# Patient Record
Sex: Female | Born: 1990 | Race: White | Hispanic: No | Marital: Single
Health system: Southern US, Community
[De-identification: ages and names within clinical notes are randomized; demographics above are authoritative.]

---

## 2020-11-21 ENCOUNTER — Ambulatory Visit
Admission: RE | Admit: 2020-11-21 | Discharge: 2020-11-21 | Disposition: A | Discharge status: Home / Self Care | Payer: No Typology Code available for payment source | Source: Ambulatory Visit | Attending: Sports Medicine | Admitting: Sports Medicine

## 2020-11-21 ENCOUNTER — Ambulatory Visit (INDEPENDENT_AMBULATORY_CARE_PROVIDER_SITE_OTHER): Payer: Self-pay | Admitting: Family Medicine

## 2020-11-21 ENCOUNTER — Other Ambulatory Visit: Payer: Self-pay

## 2020-11-21 VITALS — BP 100/62 | Ht 64.0 in | Wt 165.0 lb

## 2020-11-21 DIAGNOSIS — E109 Type 1 diabetes mellitus without complications: Secondary | ICD-10-CM | POA: Insufficient documentation

## 2020-11-21 DIAGNOSIS — Z0189 Encounter for other specified special examinations: Secondary | ICD-10-CM

## 2020-11-21 DIAGNOSIS — Y9315 Activity, underwater diving and snorkeling: Secondary | ICD-10-CM

## 2020-11-21 DIAGNOSIS — E1069 Type 1 diabetes mellitus with other specified complication: Secondary | ICD-10-CM

## 2020-11-21 LAB — GLUCOSE, POCT (MANUAL RESULT ENTRY): POC Glucose: 74 mg/dl (ref 70–99)

## 2020-11-21 LAB — POCT HEMOGLOBIN: Hemoglobin: 13 g/dL (ref 11–14.6)

## 2020-11-21 LAB — POCT UA - GLUCOSE/PROTEIN
Glucose, UA: NEGATIVE
Protein, UA: POSITIVE — AB

## 2020-11-21 NOTE — Progress Notes (Signed)
   Office Visit Note   Patient: Valerie Finley           Date of Birth: 05-Sep-1990           MRN: [id number] Visit Date: 11/21/2020 Requested by: No referring provider defined for this encounter. PCP: No primary care provider on file.  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 Patient states her diabetes is well controlled, with no significant hypoglycemic events in several years. No history of Asthma, PTX, Seizures, or other significant medical problems. She considers herself overall very healthy. 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               ROS:   All other systems were reviewed and are negative.  Objective: Vital Signs: There were no vitals taken for this visit. No flowsheet data found.   No flowsheet data found.  Physical Exam:  General:  Alert and oriented, in no acute distress. Cardiac: Regular Rate rhythm, no murmurs, rubs or gallops Pulm:  CTAB. Breathing unlabored. No wheezes, rales, or rhonchi.  Abd: Soft, NTND. +BS.  Psy:  Normal mood, congruent affect. Skin:  No bruising or rashes Neuro: CN II-XII Intact bilaterally. Normal gait and balance.  MSK: Full Painless ROM in shoulders, hips, elbows, knees and wrists. Moves all extremities smoothly, with 5 out of 5 strength throughout upper and lower limbs.  Full spinal ROM without pain. Normal spinal curvature.   Imaging: Chest XR Wet read with no obvious acute cardiopulmonary process.   Assessment & Plan: 29yo F presenting to clinic for clearance to participate in SCUBA activities at the [facility]. Though she is a type 1 diabetic, her diabetes is well controlled on her current insulin regimen.  - Labs with trace proteinuria, however patient admits she is on  her menses.  - Discussed that Ideal blood glucose prior to dives is 150-300 prior to entering the water, without evidence of downtrending.  - Discussed avoiding excessively deep dives beyond 100', as hypoglycemia may mimic raptures.  - Discussed avoiding dive times greater than . She is instructed to have oral glucose snacks available at the poolside, as well as injectable glucagon in a location where it is easily accessible by co-workers if needed in an emergency.   - Cleared for diving with the above stipulations in place.     I was the preceptor for this visit and available for immediate consultation Marsa Aris, DO

## 2020-11-21 NOTE — Patient Instructions (Signed)
*  Head to [facility] to get your urine test and bloodwork. You will pay $27 at check-in. [address]  *Then go to [facility] to get your Xray done. You will pay $51 at check-in.  [address] in the [facility]  *After the xrays, come back here to get ALL of your results

## 2021-02-26 DIAGNOSIS — F411 Generalized anxiety disorder: Secondary | ICD-10-CM | POA: Diagnosis not present

## 2021-02-26 DIAGNOSIS — E039 Hypothyroidism, unspecified: Secondary | ICD-10-CM | POA: Diagnosis not present

## 2021-02-26 DIAGNOSIS — F331 Major depressive disorder, recurrent, moderate: Secondary | ICD-10-CM | POA: Diagnosis not present

## 2021-02-26 DIAGNOSIS — E1065 Type 1 diabetes mellitus with hyperglycemia: Secondary | ICD-10-CM | POA: Diagnosis not present

## 2021-03-14 DIAGNOSIS — E669 Obesity, unspecified: Secondary | ICD-10-CM | POA: Diagnosis not present

## 2021-03-14 DIAGNOSIS — E039 Hypothyroidism, unspecified: Secondary | ICD-10-CM | POA: Diagnosis not present

## 2021-03-14 DIAGNOSIS — E1065 Type 1 diabetes mellitus with hyperglycemia: Secondary | ICD-10-CM | POA: Diagnosis not present

## 2022-02-05 ENCOUNTER — Ambulatory Visit (INDEPENDENT_AMBULATORY_CARE_PROVIDER_SITE_OTHER): Payer: Self-pay | Admitting: Sports Medicine

## 2022-02-05 VITALS — BP 103/72 | Temp 98.1°F | Ht 64.0 in | Wt 155.0 lb

## 2022-02-05 DIAGNOSIS — Z0189 Encounter for other specified special examinations: Secondary | ICD-10-CM

## 2022-02-05 NOTE — Progress Notes (Signed)
  Valerie Finley - 31 y.o. female MRN [id number]  Date of birth: 12/12/1990    CHIEF COMPLAINT:   COVID clearance    SUBJECTIVE:   HPI:  This is a pleasant 31 year old female who comes to clinic to be evaluated for return to scuba diving at the [facility] after having COVID.  She tested positive for COVID-19 on 01/07/2022 with a home test.  Her symptoms at that time were nasal congestion, fever, loss of smell, and fatigue.  She does not report having much of a cough nor shortness of breath.  She did not take Paxlovid. She has since recovered quite well from COVID.  She feels like her energy levels are back to normal.  She does not have any shortness of breath with exertion.  ROS:     See HPI  PERTINENT  PMH / PSH FH / / SH:  Past Medical, Surgical, Social, and Family History Reviewed & Updated in the EMR.  Pertinent findings include:  T1DM  OBJECTIVE: BP 103/72   Temp 98.1 F (36.7 C)   Ht 5\' 4"  (1.626 m)   Wt 155 lb (70.3 kg)   BMI 26.61 kg/m   Physical Exam:  Vital signs are reviewed.  GEN: Alert and oriented, NAD Cardio: RRR, no murmur appreciated Pulm: Breathing unlabored, CTA bilat, no wheezing PSY: normal mood, congruent affect MSK: full ROM and symmetric strength in all extremities  ASSESSMENT & PLAN:  1. COVID 19 -Patient has recovered well from COVID earlier this month.  She has no residual symptoms and is tolerating full activity.  In accordance with the guidelines from the Diving and Hyperbaric Medicine Journal published in 2022, no additional work-up is required.  She is cleared to return to diving at the [facility]. She can follow up here as needed for all future sports medicine needs.  [facility], MD PGY-4, Sports Medicine Fellow [facility]  Patient seen and evaluated with the sports medicine fellow.  I agree with the above plan of care.  Valerie Finley is cleared to return to diving without restrictions.   Follow-up as needed.

## 2022-10-15 IMAGING — DX DG CHEST 2V
2 series · 2 of 2 positions shown · non-contrast
Comparison: None.

CLINICAL DATA: Athletic physical

EXAM:
CHEST - 2 VIEW

[dg chest 2 view (1 of 2)]
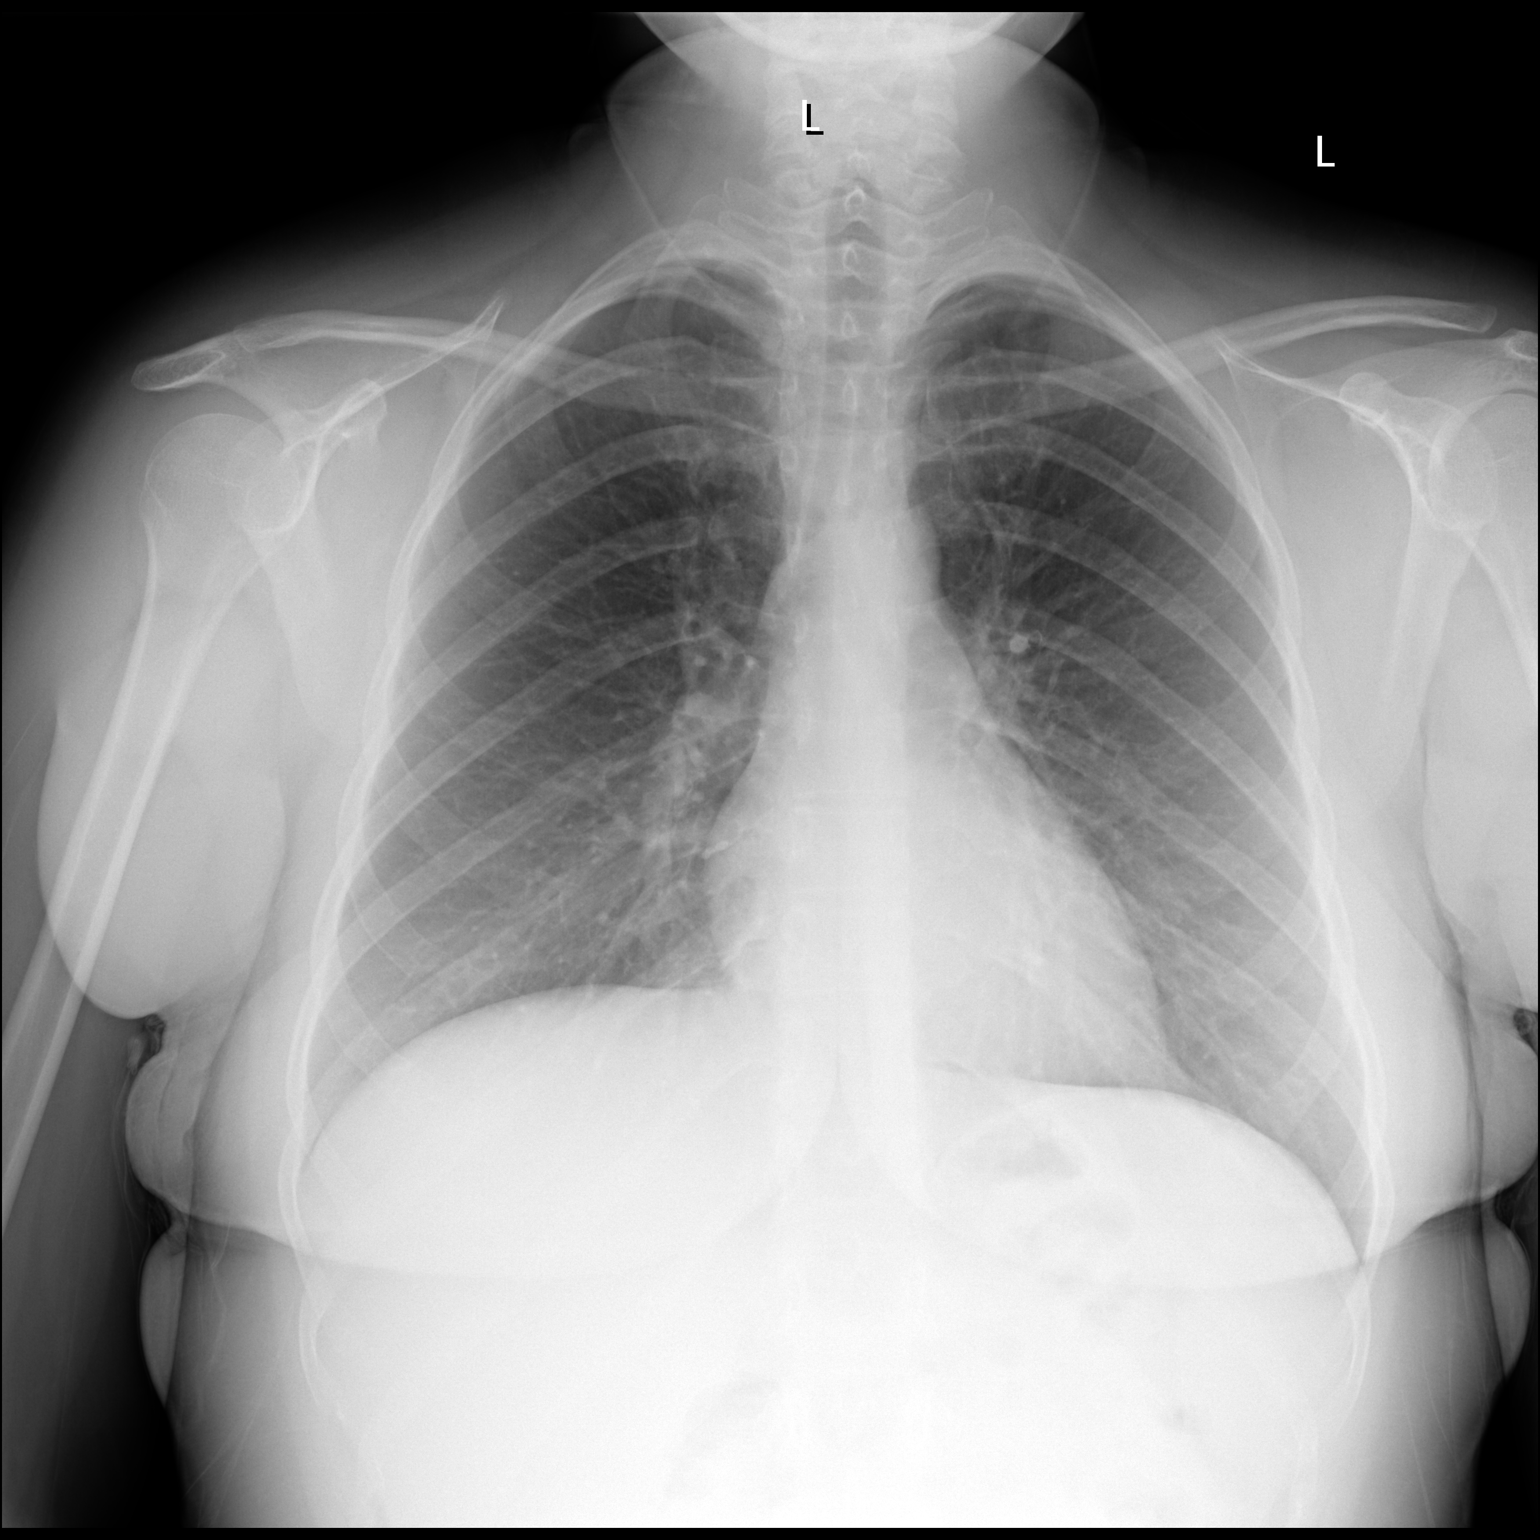

[dg chest 2 view (2 of 2)]
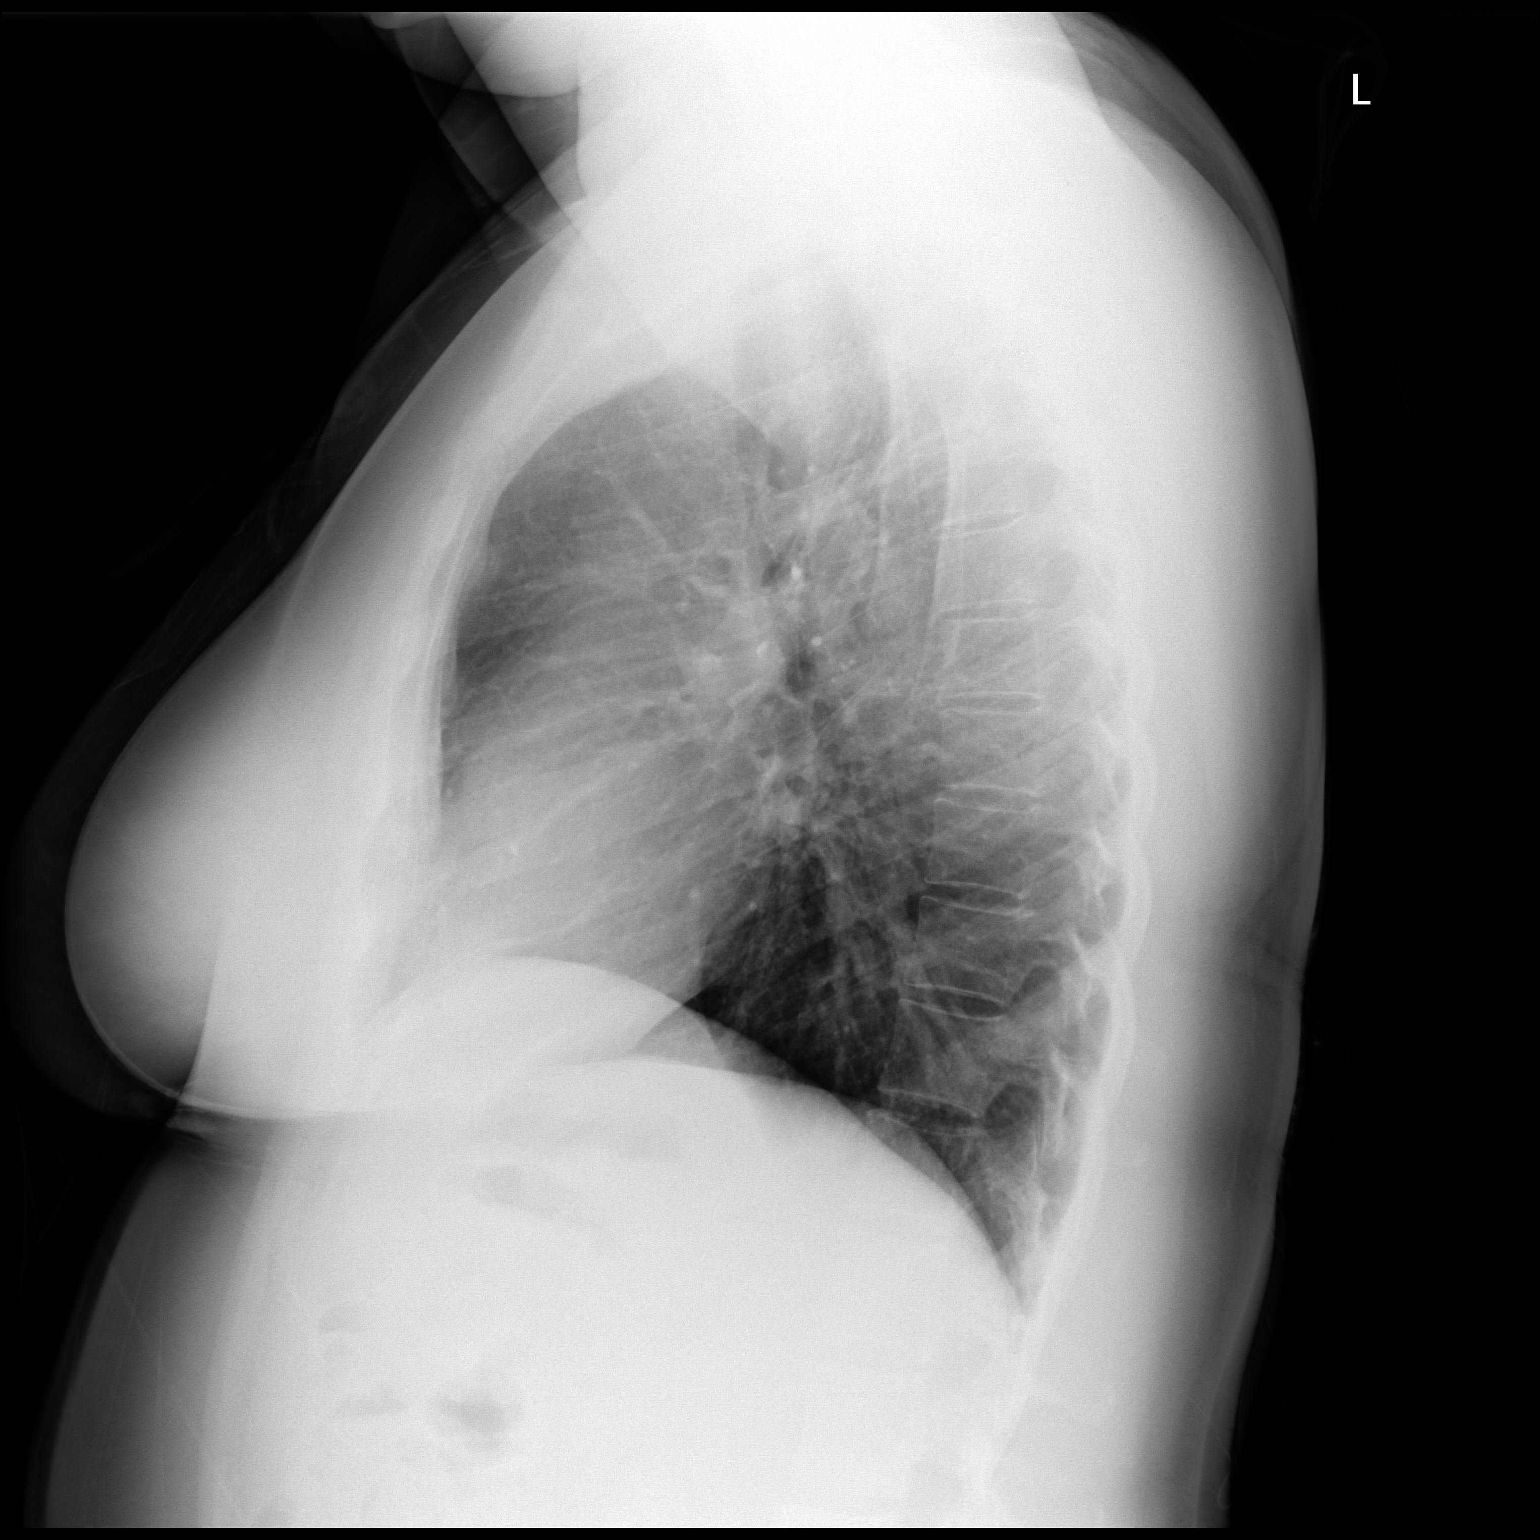

[2 of 2 positions shown; findings below may reference images not displayed]

FINDINGS: The heart size and mediastinal contours are within normal limits.
Both lungs are clear. The visualized skeletal structures are
unremarkable.
IMPRESSION: No active cardiopulmonary disease.

## 2023-03-05 ENCOUNTER — Ambulatory Visit (INDEPENDENT_AMBULATORY_CARE_PROVIDER_SITE_OTHER): Payer: Self-pay | Admitting: Sports Medicine

## 2023-03-05 VITALS — BP 90/64 | Ht 64.0 in | Wt 140.0 lb

## 2023-03-05 DIAGNOSIS — Z8616 Personal history of COVID-19: Secondary | ICD-10-CM | POA: Insufficient documentation

## 2023-03-05 DIAGNOSIS — Z0189 Encounter for other specified special examinations: Secondary | ICD-10-CM | POA: Diagnosis not present

## 2023-03-05 NOTE — Progress Notes (Unsigned)
CHIEF COMPLAINT:   Scuba COVID clearance   SUBJECTIVE:  This is a pleasant 32 year old female who comes to clinic to be evaluated for return to scuba diving at the [facility] after having COVID in late August 2024.  She has no residual symptoms especially no fevers, congestion, sinus pressure/drainage, ear pressure/pain, cough or shortness of breath at rest or with exertion. She feels like her energy levels are back to normal.  See PMH below.   ROS:     See HPI   PERTINENT  PMH / PSH FH / / SH:  Past Medical, Surgical, Social, and Family History Reviewed & Updated in the EMR.  Pertinent findings include:  -T1DM - well controlled, most recent A1c < 7 (per pt). Denies hypoglycemia events below 70. On Basal/Bolus/SSI. -Hypothyroidism   OBJECTIVE: BP 90/64   Ht 5\' 4"  (1.626 m)   Wt 140 lb (63.5 kg)   BMI 24.03 kg/m  Physical Exam:  Vital signs are reviewed.   GEN: Alert and oriented, NAD Cardio: RRR, no murmur appreciated Pulm: Breathing unlabored, CTA bilat, no wheezing PSY: Normal mood, congruent affect MSK: full ROM and symmetric strength in all extremities   ASSESSMENT & PLAN:   1. History of COVID-19 2. Encounter for other specified special examinations Patient has recovered well from COVID earlier this month.  She has no residual symptoms and is tolerating full activity.  In accordance with the guidelines from the Diving and Hyperbaric Medicine Journal published in 2022, no additional work-up is required.  She is cleared to return to diving at the [facility] - paperwork completed in office today. She can follow up here as needed for all future sports medicine needs.   Glean Salen, MD PGY-4, Sports Medicine Fellow [facility]  Attending Addendum:  Patient seen in the office by fellow.  History, exam, plan of care were precepted with me.  Darene Lamer, DO, CAQSM

## 2023-05-12 DIAGNOSIS — F411 Generalized anxiety disorder: Secondary | ICD-10-CM | POA: Diagnosis not present

## 2023-05-12 DIAGNOSIS — F430 Acute stress reaction: Secondary | ICD-10-CM | POA: Diagnosis not present

## 2023-05-12 DIAGNOSIS — F331 Major depressive disorder, recurrent, moderate: Secondary | ICD-10-CM | POA: Diagnosis not present

## 2023-05-24 DIAGNOSIS — J069 Acute upper respiratory infection, unspecified: Secondary | ICD-10-CM | POA: Diagnosis not present

## 2023-05-24 DIAGNOSIS — J40 Bronchitis, not specified as acute or chronic: Secondary | ICD-10-CM | POA: Diagnosis not present

## 2023-06-07 DIAGNOSIS — E559 Vitamin D deficiency, unspecified: Secondary | ICD-10-CM | POA: Diagnosis not present

## 2023-06-07 DIAGNOSIS — R5383 Other fatigue: Secondary | ICD-10-CM | POA: Diagnosis not present

## 2023-06-07 DIAGNOSIS — R739 Hyperglycemia, unspecified: Secondary | ICD-10-CM | POA: Diagnosis not present

## 2023-06-07 DIAGNOSIS — Z13220 Encounter for screening for lipoid disorders: Secondary | ICD-10-CM | POA: Diagnosis not present

## 2023-06-08 DIAGNOSIS — F411 Generalized anxiety disorder: Secondary | ICD-10-CM | POA: Diagnosis not present

## 2023-06-08 DIAGNOSIS — G4700 Insomnia, unspecified: Secondary | ICD-10-CM | POA: Diagnosis not present

## 2023-06-08 DIAGNOSIS — F331 Major depressive disorder, recurrent, moderate: Secondary | ICD-10-CM | POA: Diagnosis not present

## 2023-06-08 DIAGNOSIS — E1065 Type 1 diabetes mellitus with hyperglycemia: Secondary | ICD-10-CM | POA: Diagnosis not present

## 2023-06-15 DIAGNOSIS — E1065 Type 1 diabetes mellitus with hyperglycemia: Secondary | ICD-10-CM | POA: Diagnosis not present

## 2023-06-15 DIAGNOSIS — Z0000 Encounter for general adult medical examination without abnormal findings: Secondary | ICD-10-CM | POA: Diagnosis not present

## 2023-06-18 DIAGNOSIS — G4700 Insomnia, unspecified: Secondary | ICD-10-CM | POA: Diagnosis not present

## 2023-06-18 DIAGNOSIS — E559 Vitamin D deficiency, unspecified: Secondary | ICD-10-CM | POA: Diagnosis not present

## 2023-06-18 DIAGNOSIS — F411 Generalized anxiety disorder: Secondary | ICD-10-CM | POA: Diagnosis not present

## 2023-06-18 DIAGNOSIS — E1165 Type 2 diabetes mellitus with hyperglycemia: Secondary | ICD-10-CM | POA: Diagnosis not present

## 2023-07-23 ENCOUNTER — Other Ambulatory Visit: Payer: Self-pay

## 2023-07-23 ENCOUNTER — Other Ambulatory Visit (HOSPITAL_BASED_OUTPATIENT_CLINIC_OR_DEPARTMENT_OTHER): Payer: Self-pay

## 2023-07-23 DIAGNOSIS — F331 Major depressive disorder, recurrent, moderate: Secondary | ICD-10-CM | POA: Diagnosis not present

## 2023-07-23 DIAGNOSIS — E1165 Type 2 diabetes mellitus with hyperglycemia: Secondary | ICD-10-CM | POA: Diagnosis not present

## 2023-07-23 DIAGNOSIS — F411 Generalized anxiety disorder: Secondary | ICD-10-CM | POA: Diagnosis not present

## 2023-07-23 DIAGNOSIS — G4700 Insomnia, unspecified: Secondary | ICD-10-CM | POA: Diagnosis not present

## 2023-07-23 MED ORDER — MOUNJARO 2.5 MG/0.5ML ~~LOC~~ SOAJ
2.5000 mg | SUBCUTANEOUS | 3 refills | Status: AC
  Filled 2023-07-23 – 2023-09-03 (×2): qty 2, 28d supply, fill #0

## 2023-07-23 MED ORDER — NOVOLOG FLEXPEN 100 UNIT/ML ~~LOC~~ SOPN
PEN_INJECTOR | SUBCUTANEOUS | 3 refills | Status: AC
  Filled 2023-07-23: qty 15, 50d supply, fill #0

## 2023-07-23 MED ORDER — LISDEXAMFETAMINE DIMESYLATE 30 MG PO CAPS
30.0000 mg | ORAL_CAPSULE | Freq: Every morning | ORAL | 0 refills | Status: AC
  Filled 2023-07-23: qty 30, 30d supply, fill #0

## 2023-07-23 MED ORDER — TOUJEO MAX SOLOSTAR 300 UNIT/ML ~~LOC~~ SOPN
45.0000 [IU] | PEN_INJECTOR | Freq: Every day | SUBCUTANEOUS | 6 refills | Status: AC
  Filled 2023-07-23 – 2023-07-27 (×2): qty 12, 80d supply, fill #0
  Filled 2023-07-31: qty 15, 90d supply, fill #0

## 2023-07-25 ENCOUNTER — Other Ambulatory Visit (HOSPITAL_BASED_OUTPATIENT_CLINIC_OR_DEPARTMENT_OTHER): Payer: Self-pay

## 2023-07-27 ENCOUNTER — Other Ambulatory Visit (HOSPITAL_BASED_OUTPATIENT_CLINIC_OR_DEPARTMENT_OTHER): Payer: Self-pay

## 2023-07-31 ENCOUNTER — Other Ambulatory Visit (HOSPITAL_BASED_OUTPATIENT_CLINIC_OR_DEPARTMENT_OTHER): Payer: Self-pay

## 2023-08-02 ENCOUNTER — Other Ambulatory Visit (HOSPITAL_BASED_OUTPATIENT_CLINIC_OR_DEPARTMENT_OTHER): Payer: Self-pay

## 2023-08-04 ENCOUNTER — Other Ambulatory Visit (HOSPITAL_BASED_OUTPATIENT_CLINIC_OR_DEPARTMENT_OTHER): Payer: Self-pay

## 2023-08-04 DIAGNOSIS — F988 Other specified behavioral and emotional disorders with onset usually occurring in childhood and adolescence: Secondary | ICD-10-CM | POA: Diagnosis not present

## 2023-08-04 DIAGNOSIS — G4700 Insomnia, unspecified: Secondary | ICD-10-CM | POA: Diagnosis not present

## 2023-08-04 DIAGNOSIS — F331 Major depressive disorder, recurrent, moderate: Secondary | ICD-10-CM | POA: Diagnosis not present

## 2023-08-04 DIAGNOSIS — F411 Generalized anxiety disorder: Secondary | ICD-10-CM | POA: Diagnosis not present

## 2023-08-04 MED ORDER — NOVOLOG FLEXPEN 100 UNIT/ML ~~LOC~~ SOPN
PEN_INJECTOR | SUBCUTANEOUS | 3 refills | Status: AC
  Filled 2023-08-04: qty 27, 90d supply, fill #0

## 2023-08-04 MED ORDER — METFORMIN HCL ER 500 MG PO TB24
1000.0000 mg | ORAL_TABLET | Freq: Two times a day (BID) | ORAL | 1 refills | Status: AC
  Filled 2023-08-04: qty 120, 30d supply, fill #0

## 2023-08-04 MED ORDER — LAMOTRIGINE 25 MG PO TABS
25.0000 mg | ORAL_TABLET | Freq: Two times a day (BID) | ORAL | 3 refills | Status: AC
  Filled 2023-08-04: qty 180, 90d supply, fill #0

## 2023-08-04 MED ORDER — TOUJEO MAX SOLOSTAR 300 UNIT/ML ~~LOC~~ SOPN
46.0000 [IU] | PEN_INJECTOR | Freq: Every day | SUBCUTANEOUS | 6 refills | Status: AC
  Filled 2023-08-04: qty 15, 32d supply, fill #0

## 2023-08-04 MED ORDER — LEVOTHYROXINE SODIUM 88 MCG PO TABS
88.0000 ug | ORAL_TABLET | Freq: Every morning | ORAL | 1 refills | Status: AC
  Filled 2023-08-04: qty 90, 90d supply, fill #0

## 2023-08-04 MED ORDER — DEXCOM G6 SENSOR MISC
1.0000 | 1 refills | Status: AC
  Filled 2023-08-04: qty 9, 90d supply, fill #0

## 2023-08-04 MED ORDER — SERTRALINE HCL 100 MG PO TABS
100.0000 mg | ORAL_TABLET | Freq: Every day | ORAL | 0 refills | Status: AC
  Filled 2023-08-04: qty 90, 90d supply, fill #0

## 2023-08-04 MED ORDER — LISDEXAMFETAMINE DIMESYLATE 40 MG PO CAPS
40.0000 mg | ORAL_CAPSULE | Freq: Every morning | ORAL | 0 refills | Status: DC
Start: 2023-08-04 — End: 2023-09-08
  Filled 2023-08-04: qty 25, 25d supply, fill #0
  Filled 2023-08-04: qty 5, 5d supply, fill #0

## 2023-08-05 ENCOUNTER — Other Ambulatory Visit (HOSPITAL_BASED_OUTPATIENT_CLINIC_OR_DEPARTMENT_OTHER): Payer: Self-pay

## 2023-08-09 ENCOUNTER — Other Ambulatory Visit (HOSPITAL_BASED_OUTPATIENT_CLINIC_OR_DEPARTMENT_OTHER): Payer: Self-pay

## 2023-08-16 ENCOUNTER — Ambulatory Visit (INDEPENDENT_AMBULATORY_CARE_PROVIDER_SITE_OTHER): Payer: BC Managed Care – PPO | Admitting: Orthopaedic Surgery

## 2023-08-16 ENCOUNTER — Ambulatory Visit (INDEPENDENT_AMBULATORY_CARE_PROVIDER_SITE_OTHER)

## 2023-08-16 DIAGNOSIS — M25541 Pain in joints of right hand: Secondary | ICD-10-CM

## 2023-08-16 NOTE — Progress Notes (Signed)
 Chief Complaint: Right thumb pain and triggering     History of Present Illness:    Valerie Finley is a 33 y.o. female today with ongoing right thumb loss of motion and IP flexion in the setting of tenderness and pain over the A1 pulley.  She does have a history of bilateral trigger thumb which has been injected that she did have quite good results with.  She is here today for discussion of the right thumb this is her dominant hand    PMH/PSH/Family History/Social History/Meds/Allergies:   No past medical history on file.  Social History   Socioeconomic History  . Marital status: Single    Spouse name: Not on file  . Number of children: Not on file  . Years of education: Not on file  . Highest education level: Not on file  Occupational History  . Not on file  Tobacco Use  . Smoking status: Not on file  . Smokeless tobacco: Not on file  Substance and Sexual Activity  . Alcohol use: Not on file  . Drug use: Not on file  . Sexual activity: Not on file  Other Topics Concern  . Not on file  Social History Narrative  . Not on file   Social Drivers of Health   Financial Resource Strain: Not on file  Food Insecurity: Not on file  Transportation Needs: Not on file  Physical Activity: Not on file  Stress: Not on file  Social Connections: Unknown (12/22/2021)   Received from [facility]   Social Network   . Social Network: Not on file   No family history on file. No Known Allergies Current Outpatient Medications  Medication Sig Dispense Refill  . Continuous Glucose Sensor (DEXCOM G6 SENSOR) MISC Apply 1 sensor as directed every 10 days to check blood sugar 8 times daily. 9 each 1  . insulin glargine, 2 Unit Dial, (TOUJEO MAX SOLOSTAR) 300 UNIT/ML Solostar Pen Inject 45 Units into the skin daily. 15 mL 6  . insulin glargine, 2 Unit Dial, (TOUJEO MAX SOLOSTAR) 300 UNIT/ML Solostar Pen Inject 46 Units into the skin daily. 15 mL 6  . lamoTRIgine  (LAMICTAL) 25 MG tablet Take 1 tablet (25 mg total) by mouth 2 (two) times daily. 180 tablet 3  . levothyroxine (SYNTHROID) 88 MCG tablet Take 1 tablet (88 mcg total) by mouth every morning on an empty stomach. 90 tablet 1  . lisdexamfetamine (VYVANSE) 30 MG capsule Take 1 capsule (30 mg total) by mouth every morning. 30 capsule 0  . lisdexamfetamine (VYVANSE) 40 MG capsule Take 1 capsule (40 mg total) by mouth in the morning. 30 capsule 0  . metFORMIN (GLUCOPHAGE-XR) 500 MG 24 hr tablet Take 2 tablets (1,000 mg total) by mouth 2 (two) times daily. 120 tablet 1  . NOVOLOG FLEXPEN 100 UNIT/ML FlexPen INJECT SUBCUTANEOUSLY  prior to meals  3 TIMES DAILY Sliding scale - use 5 units if sugars 150-200, use 7 units if sugars 200-250, 10 units if sugars 250-300 15 mL 3  . NOVOLOG FLEXPEN 100 UNIT/ML FlexPen Administer under the skin 3 times daily prior to meals using sliding scale. 5 units if sugars are 150-200. 7 units if 200-250, 10 units if 250-300. 15 mL 3  . sertraline (ZOLOFT) 100 MG tablet Take 1 tablet (100 mg total) by mouth daily. 90 tablet 0  . tirzepatide (MOUNJARO) 2.5 MG/0.5ML Pen Inject 2.5 mg into the skin once a week. 2 mL 3   No current  facility-administered medications for this visit.   No results found.  Review of Systems:   A ROS was performed including pertinent positives and negatives as documented in the HPI.  Physical Exam :   Constitutional: NAD and appears stated age Neurological: Alert and oriented Psych: Appropriate affect and cooperative There were no vitals taken for this visit.   Comprehensive Musculoskeletal Exam:    Palpation over the right trigger thumb area over the A1 pulley.  The IP joint does stay in flexion.  There is some clicking and popping with passive extension   Imaging:    I personally reviewed and interpreted the radiographs.   Assessment and Plan:   33 y.o. female with right thumb trigger thumb.  At today's visit I did recommend an initial  ultrasound-guided injection of the A1 pulley.  She would like to proceed with this.  I will plan to see her back as needed  -Thumb trigger thumb injection provided after verbal consent obtained    Procedure Note  Patient: Valerie Finley             Date of Birth: [birth year]           MRN: [id number]             Visit Date: 08/16/2023  Procedures: Visit Diagnoses:  1. Pain in thumb joint with movement of right hand     Hand/UE Inj: R thumb A1 for trigger finger on 08/16/2023 12:20 PM       I personally saw and evaluated the patient, and participated in the management and treatment plan.  Huel Cote, MD Attending Physician, Orthopedic Surgery  This document was dictated using Dragon voice recognition software. A reasonable attempt at proof reading has been made to minimize errors.

## 2023-08-20 ENCOUNTER — Other Ambulatory Visit (HOSPITAL_BASED_OUTPATIENT_CLINIC_OR_DEPARTMENT_OTHER): Payer: Self-pay

## 2023-08-25 DIAGNOSIS — Z713 Dietary counseling and surveillance: Secondary | ICD-10-CM | POA: Diagnosis not present

## 2023-08-30 ENCOUNTER — Other Ambulatory Visit (HOSPITAL_BASED_OUTPATIENT_CLINIC_OR_DEPARTMENT_OTHER): Payer: Self-pay

## 2023-08-31 ENCOUNTER — Telehealth (HOSPITAL_COMMUNITY): Payer: Self-pay

## 2023-08-31 NOTE — Telephone Encounter (Signed)
 TMS C: Placed outgoing call to pt to perform TMS phone screening. Call went to vm, left message requesting call back if interested in pursuing tx.

## 2023-09-01 DIAGNOSIS — F900 Attention-deficit hyperactivity disorder, predominantly inattentive type: Secondary | ICD-10-CM | POA: Diagnosis not present

## 2023-09-01 DIAGNOSIS — F411 Generalized anxiety disorder: Secondary | ICD-10-CM | POA: Diagnosis not present

## 2023-09-01 DIAGNOSIS — F332 Major depressive disorder, recurrent severe without psychotic features: Secondary | ICD-10-CM | POA: Diagnosis not present

## 2023-09-01 DIAGNOSIS — Z5181 Encounter for therapeutic drug level monitoring: Secondary | ICD-10-CM | POA: Diagnosis not present

## 2023-09-03 ENCOUNTER — Other Ambulatory Visit (HOSPITAL_BASED_OUTPATIENT_CLINIC_OR_DEPARTMENT_OTHER): Payer: Self-pay

## 2023-09-06 ENCOUNTER — Telehealth (HOSPITAL_COMMUNITY): Payer: Self-pay

## 2023-09-06 ENCOUNTER — Other Ambulatory Visit (HOSPITAL_BASED_OUTPATIENT_CLINIC_OR_DEPARTMENT_OTHER): Payer: Self-pay

## 2023-09-06 MED ORDER — MOUNJARO 5 MG/0.5ML ~~LOC~~ SOAJ
5.0000 mg | SUBCUTANEOUS | 3 refills | Status: AC
  Filled 2023-09-06: qty 2, 28d supply, fill #0
  Filled 2023-10-23 (×2): qty 2, 28d supply, fill #1

## 2023-09-06 NOTE — Telephone Encounter (Signed)
 TMS C: Coordinator placed outgoing call to pt again for a TMS phone screening. Coordinator relayed that they will be out of office on Thursday and Friday, but more than happy to conduct screening before then. Coordinator requested a call back at direct office line [phone]). Will await pt response.

## 2023-09-07 ENCOUNTER — Other Ambulatory Visit (HOSPITAL_BASED_OUTPATIENT_CLINIC_OR_DEPARTMENT_OTHER): Payer: Self-pay

## 2023-09-08 ENCOUNTER — Other Ambulatory Visit (HOSPITAL_BASED_OUTPATIENT_CLINIC_OR_DEPARTMENT_OTHER): Payer: Self-pay

## 2023-09-08 MED ORDER — LISDEXAMFETAMINE DIMESYLATE 40 MG PO CAPS
40.0000 mg | ORAL_CAPSULE | Freq: Every morning | ORAL | 0 refills | Status: AC
  Filled 2023-09-08: qty 90, 90d supply, fill #0

## 2023-09-10 ENCOUNTER — Other Ambulatory Visit (HOSPITAL_BASED_OUTPATIENT_CLINIC_OR_DEPARTMENT_OTHER): Payer: Self-pay

## 2023-09-17 ENCOUNTER — Other Ambulatory Visit (HOSPITAL_BASED_OUTPATIENT_CLINIC_OR_DEPARTMENT_OTHER): Payer: Self-pay

## 2023-09-17 DIAGNOSIS — E1165 Type 2 diabetes mellitus with hyperglycemia: Secondary | ICD-10-CM | POA: Diagnosis not present

## 2023-09-17 DIAGNOSIS — F411 Generalized anxiety disorder: Secondary | ICD-10-CM | POA: Diagnosis not present

## 2023-09-17 DIAGNOSIS — R110 Nausea: Secondary | ICD-10-CM | POA: Diagnosis not present

## 2023-09-17 MED ORDER — MOUNJARO 2.5 MG/0.5ML ~~LOC~~ SOAJ
2.5000 mg | SUBCUTANEOUS | 2 refills | Status: DC
  Filled 2023-09-17: qty 2, 28d supply, fill #0

## 2023-09-22 DIAGNOSIS — Z713 Dietary counseling and surveillance: Secondary | ICD-10-CM | POA: Diagnosis not present

## 2023-10-02 ENCOUNTER — Other Ambulatory Visit (HOSPITAL_BASED_OUTPATIENT_CLINIC_OR_DEPARTMENT_OTHER): Payer: Self-pay

## 2023-10-06 ENCOUNTER — Other Ambulatory Visit (HOSPITAL_BASED_OUTPATIENT_CLINIC_OR_DEPARTMENT_OTHER): Payer: Self-pay

## 2023-10-06 DIAGNOSIS — F411 Generalized anxiety disorder: Secondary | ICD-10-CM | POA: Diagnosis not present

## 2023-10-06 DIAGNOSIS — F900 Attention-deficit hyperactivity disorder, predominantly inattentive type: Secondary | ICD-10-CM | POA: Diagnosis not present

## 2023-10-06 DIAGNOSIS — F332 Major depressive disorder, recurrent severe without psychotic features: Secondary | ICD-10-CM | POA: Diagnosis not present

## 2023-10-07 ENCOUNTER — Other Ambulatory Visit (HOSPITAL_BASED_OUTPATIENT_CLINIC_OR_DEPARTMENT_OTHER): Payer: Self-pay

## 2023-10-07 DIAGNOSIS — E1165 Type 2 diabetes mellitus with hyperglycemia: Secondary | ICD-10-CM | POA: Diagnosis not present

## 2023-10-07 DIAGNOSIS — E669 Obesity, unspecified: Secondary | ICD-10-CM | POA: Diagnosis not present

## 2023-10-07 DIAGNOSIS — F331 Major depressive disorder, recurrent, moderate: Secondary | ICD-10-CM | POA: Diagnosis not present

## 2023-10-07 MED ORDER — DEXCOM G6 SENSOR MISC
3 refills | Status: AC
  Filled 2023-10-07 – 2023-10-23 (×2): qty 3, 30d supply, fill #0

## 2023-10-23 ENCOUNTER — Other Ambulatory Visit (HOSPITAL_BASED_OUTPATIENT_CLINIC_OR_DEPARTMENT_OTHER): Payer: Self-pay

## 2023-10-27 DIAGNOSIS — Z713 Dietary counseling and surveillance: Secondary | ICD-10-CM | POA: Diagnosis not present

## 2023-11-10 DIAGNOSIS — F411 Generalized anxiety disorder: Secondary | ICD-10-CM | POA: Diagnosis not present

## 2023-11-10 DIAGNOSIS — F331 Major depressive disorder, recurrent, moderate: Secondary | ICD-10-CM | POA: Diagnosis not present

## 2023-11-10 DIAGNOSIS — G4700 Insomnia, unspecified: Secondary | ICD-10-CM | POA: Diagnosis not present

## 2024-02-04 ENCOUNTER — Other Ambulatory Visit (HOSPITAL_BASED_OUTPATIENT_CLINIC_OR_DEPARTMENT_OTHER): Payer: Self-pay

## 2024-04-10 ENCOUNTER — Encounter: Payer: Self-pay | Admitting: Radiology
